# Patient Record
Sex: Female | Born: 1975 | Race: Black or African American | Hispanic: No | Marital: Single | Smoking: Never smoker
Health system: Southern US, Community
[De-identification: ages and names within clinical notes are randomized; demographics above are authoritative.]

---

## 2017-11-11 ENCOUNTER — Encounter (HOSPITAL_COMMUNITY): Payer: Self-pay

## 2017-11-11 ENCOUNTER — Other Ambulatory Visit: Payer: Self-pay

## 2017-11-11 ENCOUNTER — Emergency Department (HOSPITAL_COMMUNITY): Payer: No Typology Code available for payment source

## 2017-11-11 ENCOUNTER — Emergency Department (HOSPITAL_COMMUNITY)
Admission: EM | Admit: 2017-11-11 | Discharge: 2017-11-11 | Disposition: A | Discharge status: Home / Self Care | Payer: No Typology Code available for payment source | Attending: Emergency Medicine | Admitting: Emergency Medicine

## 2017-11-11 DIAGNOSIS — R51 Headache: Secondary | ICD-10-CM | POA: Insufficient documentation

## 2017-11-11 DIAGNOSIS — Y92410 Unspecified street and highway as the place of occurrence of the external cause: Secondary | ICD-10-CM | POA: Diagnosis not present

## 2017-11-11 DIAGNOSIS — Y999 Unspecified external cause status: Secondary | ICD-10-CM | POA: Insufficient documentation

## 2017-11-11 DIAGNOSIS — M542 Cervicalgia: Secondary | ICD-10-CM | POA: Diagnosis present

## 2017-11-11 DIAGNOSIS — Y9389 Activity, other specified: Secondary | ICD-10-CM | POA: Insufficient documentation

## 2017-11-11 DIAGNOSIS — M25512 Pain in left shoulder: Secondary | ICD-10-CM | POA: Insufficient documentation

## 2017-11-11 MED ORDER — METHOCARBAMOL 500 MG PO TABS: 500.0000 mg | ORAL_TABLET | Freq: Two times a day (BID) | ORAL | 0 refills | Status: AC

## 2017-11-11 MED ORDER — METHOCARBAMOL 500 MG PO TABS
500.0000 mg | ORAL_TABLET | Freq: Once | ORAL | Status: AC
  Administered 2017-11-11: 500 mg via ORAL
  Filled 2017-11-11: qty 1

## 2017-11-11 MED ORDER — ACETAMINOPHEN 325 MG PO TABS
650.0000 mg | ORAL_TABLET | Freq: Once | ORAL | Status: AC
Start: 2017-11-11 — End: 2017-11-11
  Administered 2017-11-11: 650 mg via ORAL
  Filled 2017-11-11: qty 2

## 2017-11-11 MED ORDER — IBUPROFEN 200 MG PO TABS
600.0000 mg | ORAL_TABLET | Freq: Once | ORAL | Status: AC
  Administered 2017-11-11: 600 mg via ORAL
  Filled 2017-11-11: qty 3
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
## 2017-11-11 NOTE — ED Notes (Signed)
Bed: WTR6 Expected date:  Expected time:  Means of arrival:  Comments: 
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
## 2017-11-11 NOTE — ED Notes (Signed)
Patient transported to X-ray 
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
